# Patient Record
Sex: Male | Born: 1960 | Race: White | Hispanic: No | Marital: Single | Smoking: Never smoker
Health system: Southern US, Community
[De-identification: ages and names within clinical notes are randomized; demographics above are authoritative.]

## PROBLEM LIST (undated history)

## (undated) DIAGNOSIS — K4020 Bilateral inguinal hernia, without obstruction or gangrene, not specified as recurrent: Secondary | ICD-10-CM

---

## 2010-04-20 DEATH — deceased

## 2013-06-04 ENCOUNTER — Emergency Department (HOSPITAL_COMMUNITY): Payer: Self-pay

## 2013-06-04 ENCOUNTER — Encounter (HOSPITAL_COMMUNITY): Payer: Self-pay | Admitting: Emergency Medicine

## 2013-06-04 ENCOUNTER — Emergency Department (HOSPITAL_COMMUNITY)
Admission: EM | Admit: 2013-06-04 | Discharge: 2013-06-04 | Disposition: A | Discharge status: Home / Self Care | Payer: Self-pay | Attending: Emergency Medicine | Admitting: Emergency Medicine

## 2013-06-04 DIAGNOSIS — Z8719 Personal history of other diseases of the digestive system: Secondary | ICD-10-CM | POA: Insufficient documentation

## 2013-06-04 DIAGNOSIS — G459 Transient cerebral ischemic attack, unspecified: Secondary | ICD-10-CM | POA: Insufficient documentation

## 2013-06-04 HISTORY — DX: Bilateral inguinal hernia, without obstruction or gangrene, not specified as recurrent: K40.20

## 2013-06-04 MED ORDER — ASPIRIN 81 MG PO CHEW
324.0000 mg | CHEWABLE_TABLET | Freq: Once | ORAL | Status: AC
  Administered 2013-06-04: 324 mg via ORAL
  Filled 2013-06-04: qty 4

## 2013-06-04 MED ORDER — ASPIRIN 81 MG PO CHEW: 324.0000 mg | CHEWABLE_TABLET | Freq: Every day | ORAL | Status: AC

## 2013-06-04 NOTE — ED Provider Notes (Signed)
CSN: [id number]     Arrival date & time 06/04/13  1144 History  This chart was scribed for Gerhard Munchobert Onyx Schirmer, MD by Quintella ReichertMatthew Underwood, ED scribe.  This patient was seen in room APA01/APA01 and the patient's care was started at 11:49 AM.   Chief Complaint  Patient presents with  . Transient Ischemic Attack    The history is provided by the patient. No language interpreter was used.    HPI Comments: Stephen CohoJames E Aliano Jr. is a 53 y.o. male who presents to the Emergency Department complaining of a possible TIA that occurred pta.  Pt reports he was at work when his coworker noticed his speech was slurred.  Pt states "I couldn't get the words out like I wanted to for about 30 seconds."  This has since resolved and currently he feels fine.  He states the episode was not associated with focal weakness, lightheadedness, pain to any area, or any other associated symptoms.  Pt takes Extra Strength Tylenol approximately 4x/week but denies excessive use.  He denies any other regular medication use.  He quit drinking 30 months ago.  He denies illicit drug use.  He smokes occasionally.  He denies any recent life changes.   Past Medical History  Diagnosis Date  . Hernia, inguinal, bilateral     History reviewed. No pertinent past surgical history.  No family history on file.   History  Substance Use Topics  . Smoking status: Never Smoker   . Smokeless tobacco: Not on file  . Alcohol Use: No     Review of Systems  Constitutional:       Per HPI, otherwise negative  HENT:       Per HPI, otherwise negative  Respiratory:       Per HPI, otherwise negative  Cardiovascular:       Per HPI, otherwise negative  Gastrointestinal: Negative for vomiting.  Endocrine:       Negative aside from HPI  Genitourinary:       Neg aside from HPI   Musculoskeletal:       Per HPI, otherwise negative  Skin: Negative.   Neurological: Positive for speech difficulty (transient). Negative for syncope, weakness,  light-headedness and headaches.      Allergies  Review of patient's allergies indicates no known allergies.  Home Medications   Prior to Admission medications   Not on File   BP 149/91  Pulse 85  Temp(Src) 97.7 F (36.5 C) (Oral)  Resp 18  Ht 5\' 8"  (1.727 m)  Wt 190 lb (86.183 kg)  BMI 28.90 kg/m2  SpO2 99%  Physical Exam  Nursing note and vitals reviewed. Constitutional: He is oriented to person, place, and time. He appears well-developed. No distress.  HENT:  Head: Normocephalic and atraumatic.  Eyes: Conjunctivae and EOM are normal.  Cardiovascular: Normal rate, regular rhythm and normal heart sounds.   No murmur heard. Pulmonary/Chest: Effort normal and breath sounds normal. No stridor. No respiratory distress. He has no wheezes. He has no rales.  Abdominal: He exhibits no distension.  Musculoskeletal: He exhibits no edema.  Neurological: He is alert and oriented to person, place, and time. He has normal strength. He displays no tremor. No cranial nerve deficit or sensory deficit. He displays a negative Romberg sign. He displays no seizure activity. Coordination normal.  Skin: Skin is warm and dry.  Psychiatric: He has a normal mood and affect.    ED Course  Procedures (including critical care time)  DIAGNOSTIC STUDIES: Oxygen  Saturation is 99% on room air, normal by my interpretation.    COORDINATION OF CARE: 11:55 AM-Discussed treatment plan which includes head CT, EKG and aspirin with pt at bedside and pt agreed to plan.   Update: Patient and multiple family members aware of all results. We discussed possible courses of action, including inpatient evaluation, MRI, versus discharge with outpatient management. Patient voiced a preference for outpatient management given monitored concerns.   Labs Review Labs Reviewed - No data to display   Imaging Review Ct Head Wo Contrast  06/04/2013   CLINICAL DATA:  Slurred speech.  Present neural defects.  EXAM: CT  HEAD WITHOUT CONTRAST  TECHNIQUE: Contiguous axial images were obtained from the base of the skull through the vertex without intravenous contrast.  COMPARISON:  None.  FINDINGS: There is an area along the left insular cortical ribbon that shows low-attenuation, continuous with some mild low attenuation in the overlying frontal lobe. This could reflect a recent infarct, but given history, is likely a chronic finding.  No other evidence suggesting a recent infarct. There are no parenchymal masses or mass effect. Ventricles are normal in configuration. There is ventricular and sulcal enlargement reflecting mild atrophy.  There are no extra-axial masses or abnormal fluid collections.  No intracranial hemorrhage.  Minimal dependent fluid lies in the right maxillary and right sphenoid sinuses. There is mucosal thickening lining the ethmoid air cells. Clear mastoid air cells.  IMPRESSION: 1. 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 This may be a chronic finding. 2. No other evidence of a recent infarct or other acute abnormality. 3. Mild atrophy. 4. Mild sinus disease including right maxillary and right sphenoid sinus fluid levels.   Electronically Signed   By: Amie Portlandavid  Ormond M.D.   On: 06/04/2013 13:13     EKG Interpretation   Date/Time:  Thursday June 04 2013 12:02:53 EDT Ventricular Rate:  74 PR Interval:  148 QRS Duration: 86 QT Interval:  394 QTC Calculation: 437 R Axis:   61 Text Interpretation:  Normal sinus rhythm Normal ECG No previous ECGs  available Sinus rhythm Normal ECG Confirmed by Gerhard MunchLOCKWOOD, Rameses Ou  MD ([id number])  on 06/04/2013 3:26:46 PM      MDM   Final diagnoses:  TIA (transient ischemic attack)    I personally performed the services described in this documentation, which was scribed in my presence. The recorded information has been reviewed and is accurate.   Patient presents with an episode of speech difficulty.  On my exam  the patient has no neurologic deficits, is awake, alert, hemodynamically stable, and in no distress. Almost immediately the patient was is concern for financial issues.  Given these concerns and the patient's CT scan to rule out bleed versus mass.  CT scan showed possible old infarct.  Patient has no ongoing neurologic deficits.  Given any further evaluation, though with the patient's lack of financial resources, first we discussed his case with case management to arrange assistance.  Second, patient was started on full dose aspirin given his ABCD2 score of 2. Patient will follow up as an outpatient for the remainder of his stroke/TIA evaluation, per his request.   Gerhard Munchobert Tanya Marvin, MD 06/04/13 [phone]

## 2013-06-04 NOTE — ED Notes (Signed)
Pt states he consumed an energy drink around 0230 this morning. States about two hours ago his co-worker noted pt's speech was slurred and told him he needed to get checked out. Pt has no neuro deficits at present. States he never had pain and was not aware his speech was slurred

## 2013-06-04 NOTE — Discharge Instructions (Signed)
As discussed, you may have had a transient ischemic attack, or TIA.  It is very important that you continue to have your condition evaluated by your physicians as an outpatient as soon as possible.  Please do not hesitate to return here if you develop new, or concerning changes in your condition, in the interval.  It is important to take medication as directed until you have been seen and evaluated by your primary care team.

## 2015-06-16 IMAGING — CT CT HEAD W/O CM
1 series · 15 of 30 positions shown, 19 images · non-contrast
Comparison: None.

CLINICAL DATA: Slurred speech.  Present neural defects.

EXAM:
CT HEAD WITHOUT CONTRAST
TECHNIQUE: Contiguous axial images were obtained from the base of the skull
through the vertex without intravenous contrast.

[Series 2: headseq 4.8 h37s · axial · 0.43mm/px · z∈[+1108,+1258]mm · 15 of 36 slices shown, 19 images]
[im 2/36  brain]
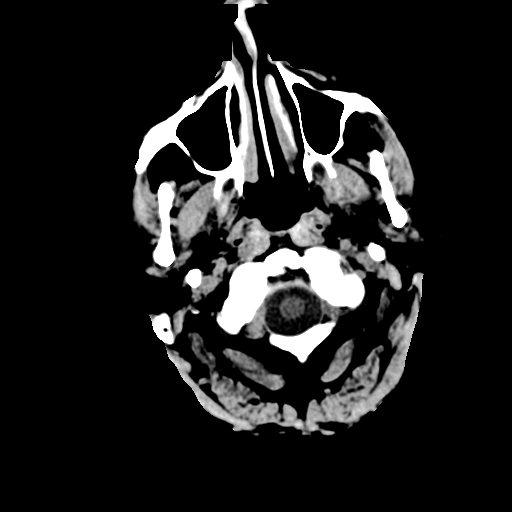
[im 2/36  bone]
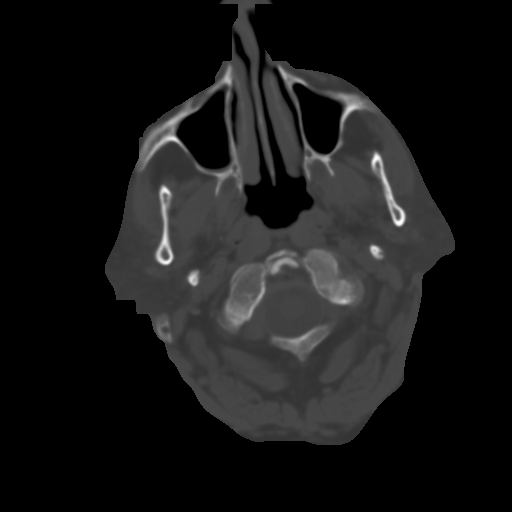
[im 4/36  brain]
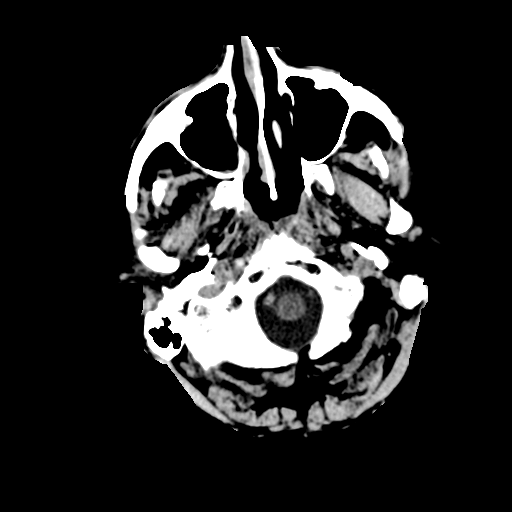
[im 7/36  brain]
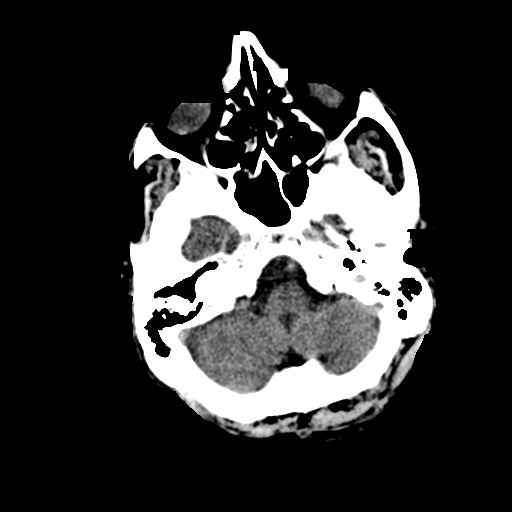
[im 9/36  brain]
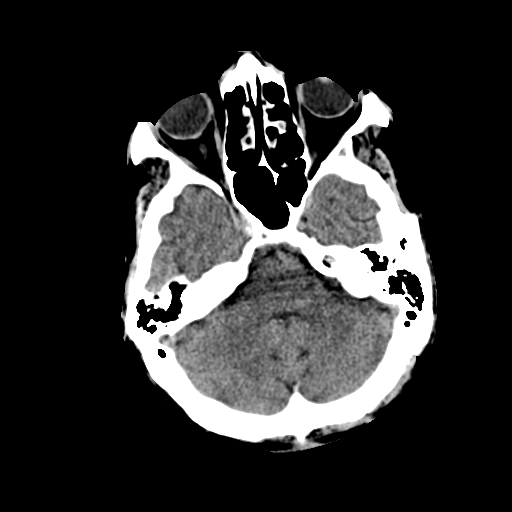
[im 11/36  brain]
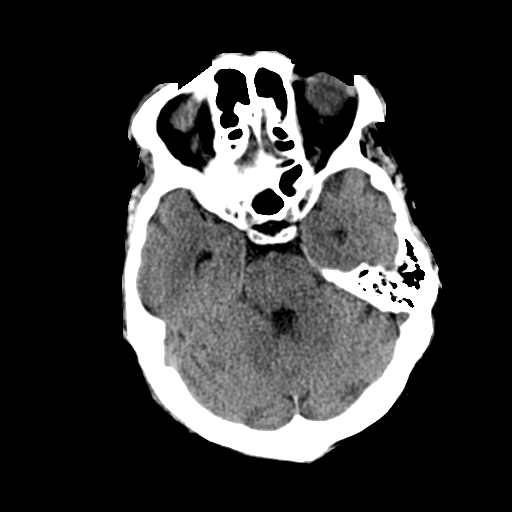
[im 11/36  bone]
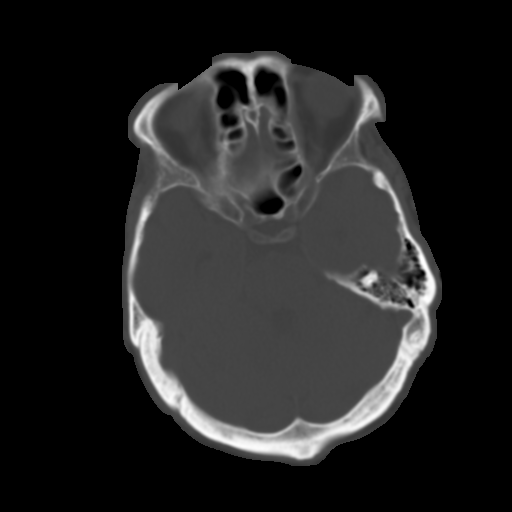
[im 13/36  brain]
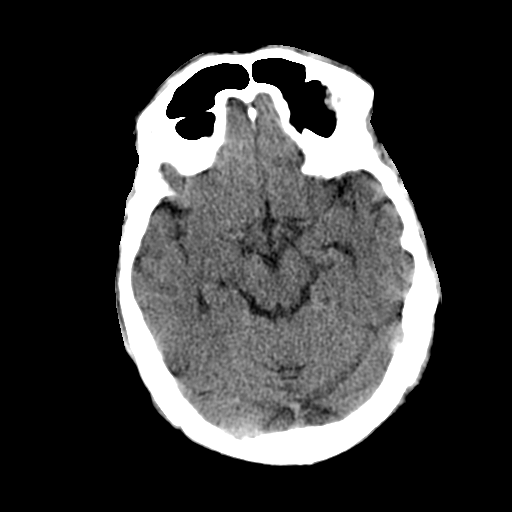
[im 15/36  brain]
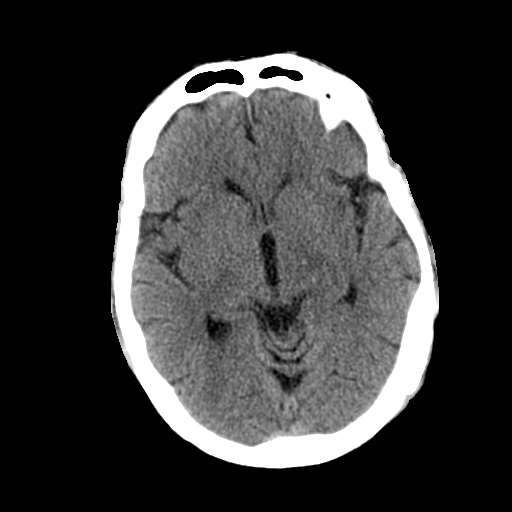
[im 17/36  brain]
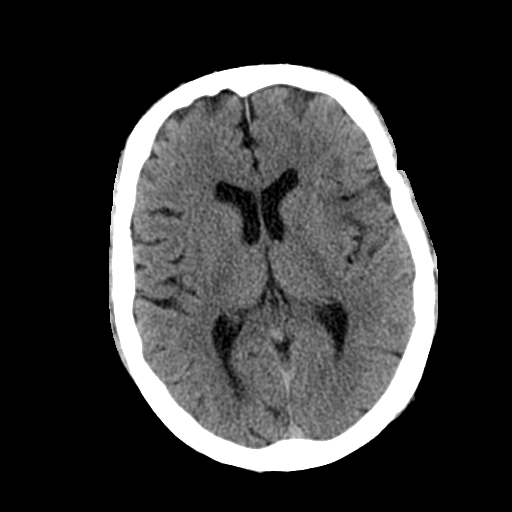
[im 20/36  brain]
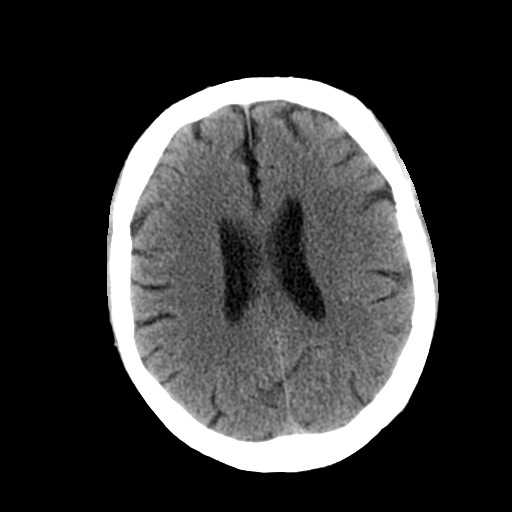
[im 20/36  bone]
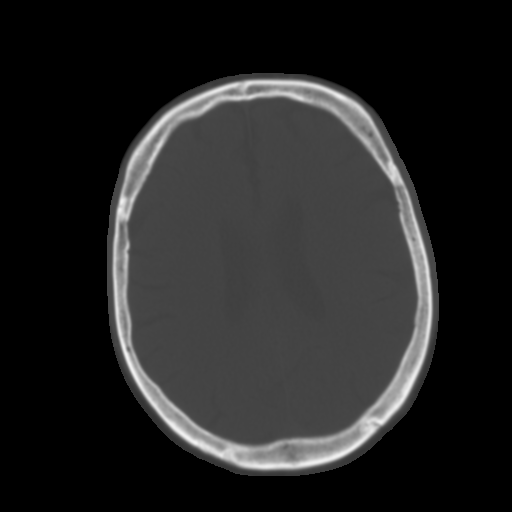
[im 22/36  brain]
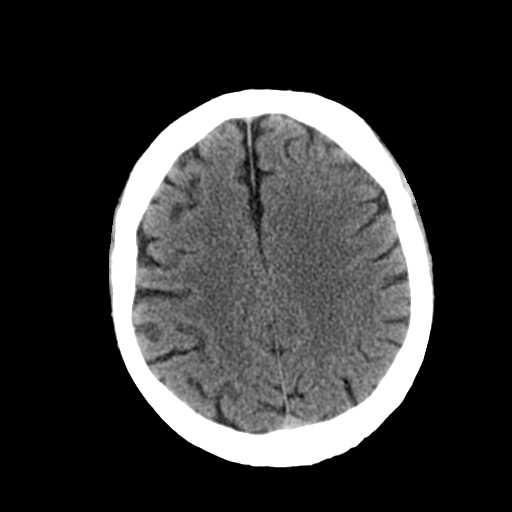
[im 23/36  brain]
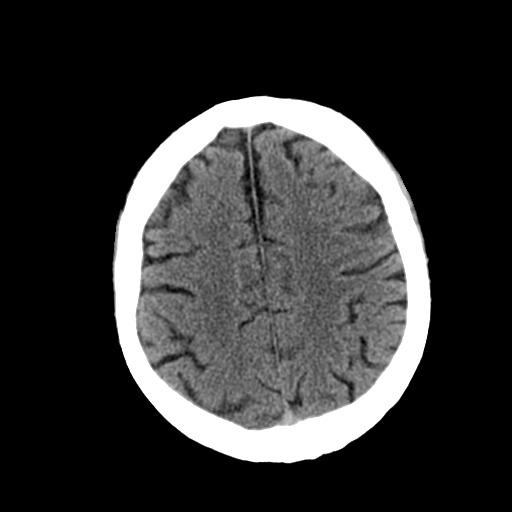
[im 26/36  brain]
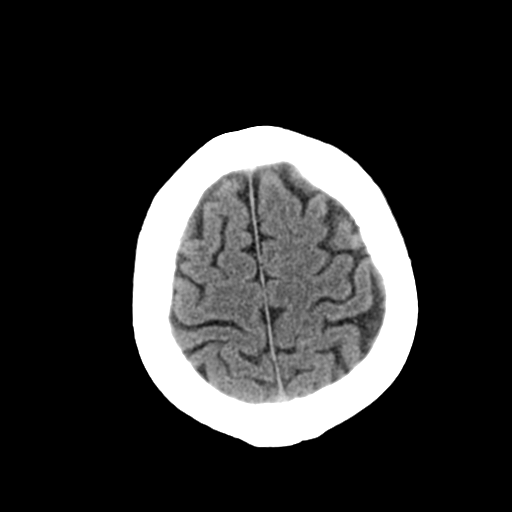
[im 28/36  brain]
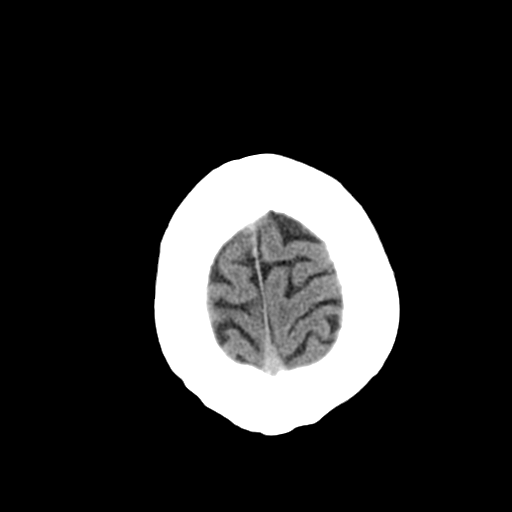
[im 28/36  bone]
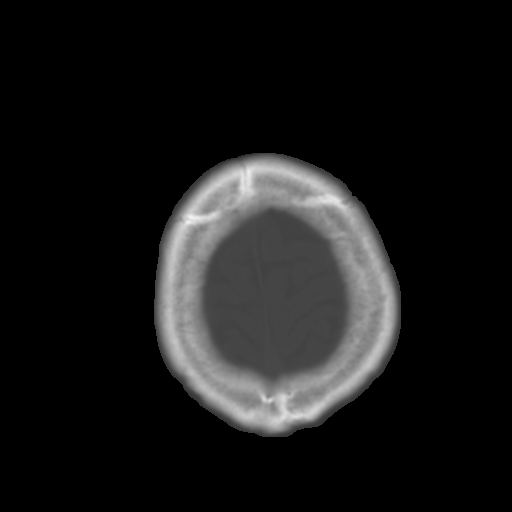
[im 31/36  brain]
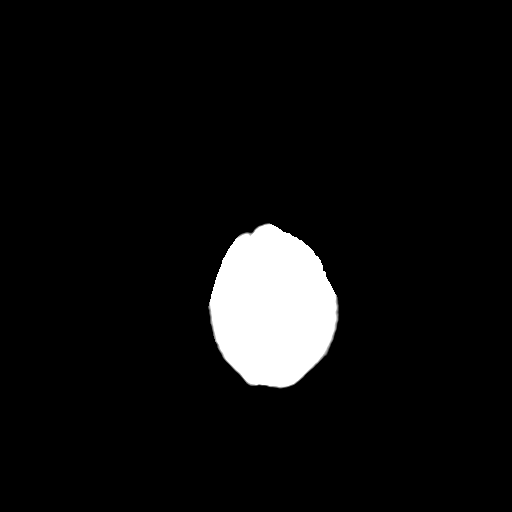
[im 33/36  brain]
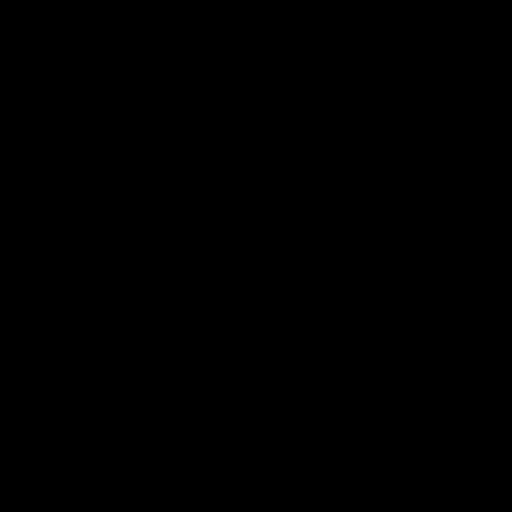

[15 of 30 positions shown; findings below may reference images not displayed]

FINDINGS: There is an area along the left insular cortical ribbon that shows
low-attenuation, continuous with some mild low attenuation in the
overlying frontal lobe. This could reflect a recent infarct, but
given history, is likely a chronic finding.

No other evidence suggesting a recent infarct. There are no
parenchymal masses or mass effect. Ventricles are normal in
configuration. There is ventricular and sulcal enlargement
reflecting mild atrophy.

There are no extra-axial masses or abnormal fluid collections.

No intracranial hemorrhage.

Minimal dependent fluid lies in the right maxillary and right
sphenoid sinuses. There is mucosal thickening lining the ethmoid air
cells. Clear mastoid air cells.
IMPRESSION: 1. Possible recent, small infarct involving the left insular cortex
and adjacent left frontal lobe. This may be a chronic finding.
2. No other evidence of a recent infarct or other acute abnormality.
3. Mild atrophy.
4. Mild sinus disease including right maxillary and right sphenoid
sinus fluid levels.
